# Patient Record
Sex: Male | Born: 1990 | Race: Black or African American | Hispanic: No | Marital: Married
Health system: Southern US, Community
[De-identification: ages and names within clinical notes are randomized; demographics above are authoritative.]

---
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
## 2012-01-13 NOTE — ED Provider Notes (Signed)
KNOWN ALLERGIES   NKDA   Seasonal       TRIAGE (Fri Jan 13, 2012 06:55 LDS0)   PATIENT: NAME: [name], [name], AGE: 21, GENDER: male, DOB: Mon         1991/01/12, TIME OF GREET: Fri Jan 13, 2012 06:49, LANGUAGE:         [place], [place]: [id number], KG WEIGHT: 131.5, MEDICAL RECORD NUMBER:         [id number], ACCOUNT NUMBER: [id number], PCP: Pt Denies,. (Fri Jan 13, 2012         06:55 LDS0)   ADMISSION: URGENCY: 3, TRANSPORT: Ambulatory, DEPT: Emergency,         BED: WAITING. (Fri Jan 13, 2012 06:55 LDS0)   VITAL SIGNS: BP 143/74, Pulse 62, Resp 14, Temp 98.6, Pain 9, O2         Sat 98, on Room air, Time 01/13/2012 06:54. (06:54 LDS0)   COMPLAINT:  Toothache H/A. Caleen Essex Jan 13, 2012 06:55 LDS0)   PRESENTING COMPLAINT:  PAIN RIGHT LOWER JAW WITH HEADACHE SINCE         0530. (06:56 LDS0)   PROVIDERS: TRIAGE NURSE: Bynum Bellows, RN, CEN. [phone] Jan 13, 2012 06:55 LDS0)   PREVIOUS VISIT ALLERGIES: Nkda, Seasonal. (Fri Jan 13, 2012 06:55         LDS0)       PRESENTING PROBLEM (06:55 LDS0)      Presenting problems: Dental Pain, Headache.       CURRENT MEDICATIONS (06:55 LDS0)   Patient not taking meds       NURSING ASSESSMENT: DENTAL (07:37 TRF0)   CONSTITUTIONAL: History obtained from patient, Patient arrives         ambulatory, Gait steady, Patient appears comfortable, Patient         cooperative, Patient alert, Oriented to person, place and time, Skin         warm, Skin dry, Skin normal in color, Mucous membranes pink, Mucous         membranes moist, Patient is well-groomed.   PAIN: throbbing pain, to left upper back tooth (teeth).   DENTAL: Dental assessment findings include mouth normal, Teeth         normal.       NURSING PROCEDURE: DISCHARGE NOTE (07:39 TRF0)   DISCHARGE: Patient discharged to home, ambulating without         assistance, driving self, unaccompanied, Discharge instructions given         to patient, Simple or moderate discharge teaching performed, by t.          fitz, rn, Prescriptions given and instructions on side effects given,         Name of prescription(s) given: naprosyn, pcn vk.       DIAGNOSIS (07:14 EI)   FINAL: PRIMARY: acute facial pain, ADDITIONAL: Cephalgia, Dental         caries.       DISPOSITION   PATIENT:  Disposition Type: Discharged, Disposition: Discharge,         Condition: Stable. (07:14 EI)      Patient left the department. (07:42 TRF0)       VITAL SIGNS (06:54 LDS0)   VITAL SIGNS: BP: 143/74, Pulse: 62, Resp: 14, Temp: 98.6, Pain:         9, O2 sat: 98 on Room air, Time: 01/13/2012 06:54.       INSTRUCTION (07:14 EI)   DISCHARGE:  DENTAL CARIES - (  [facility]), DENTAL PAIN - [facility]).   FOLLOWUPVinie Sill, SURGERY, [id number],         [place] [address], [phone].   SPECIAL:  Follow-up with a dentist as planned.         Return to the ER if condition worsens or new symptoms develop.       PRESCRIPTION (07:13 EI)   Naprosyn:  Tablet : 500 mg : Oral : Quantity: *** 1 *** Unit: tab         Route: Oral Schedule: 2 times a day Dispense: *** 20 ***.   NOTES:  No refills          May use generic.   Penicillin V Potassium:  Tablet : V potassium 500 mg : Oral :         Quantity: *** 1 *** Unit: tab Route: Oral Schedule: 4 times a day         Dispense: *** 28 ***.   NOTES   Key:     EI=Icenbice, PA-C, Erin  LDS0=Shoemaker, RN, CEN, [employer]  TRF0=Fitz, (TY),     RN, [employer]

## 2014-12-11 ENCOUNTER — Encounter: Attending: Surgery

## 2015-06-05 ENCOUNTER — Encounter (HOSPITAL_BASED_OUTPATIENT_CLINIC_OR_DEPARTMENT_OTHER): Payer: Self-pay | Admitting: Emergency Medicine

## 2015-06-05 ENCOUNTER — Emergency Department (HOSPITAL_BASED_OUTPATIENT_CLINIC_OR_DEPARTMENT_OTHER): Payer: Self-pay

## 2015-06-05 ENCOUNTER — Emergency Department (HOSPITAL_BASED_OUTPATIENT_CLINIC_OR_DEPARTMENT_OTHER)
Admission: EM | Admit: 2015-06-05 | Discharge: 2015-06-05 | Disposition: A | Discharge status: Home / Self Care | Payer: Self-pay | Attending: Emergency Medicine | Admitting: Emergency Medicine

## 2015-06-05 DIAGNOSIS — R200 Anesthesia of skin: Secondary | ICD-10-CM | POA: Insufficient documentation

## 2015-06-05 DIAGNOSIS — S8992XA Unspecified injury of left lower leg, initial encounter: Secondary | ICD-10-CM | POA: Insufficient documentation

## 2015-06-05 DIAGNOSIS — Y9289 Other specified places as the place of occurrence of the external cause: Secondary | ICD-10-CM | POA: Insufficient documentation

## 2015-06-05 DIAGNOSIS — W1843XA Slipping, tripping and stumbling without falling due to stepping from one level to another, initial encounter: Secondary | ICD-10-CM | POA: Insufficient documentation

## 2015-06-05 DIAGNOSIS — M25562 Pain in left knee: Secondary | ICD-10-CM

## 2015-06-05 DIAGNOSIS — Y998 Other external cause status: Secondary | ICD-10-CM | POA: Insufficient documentation

## 2015-06-05 DIAGNOSIS — S3992XA Unspecified injury of lower back, initial encounter: Secondary | ICD-10-CM | POA: Insufficient documentation

## 2015-06-05 DIAGNOSIS — Y9389 Activity, other specified: Secondary | ICD-10-CM | POA: Insufficient documentation

## 2015-06-05 NOTE — ED Notes (Signed)
Patient transported to X-ray 

## 2015-06-05 NOTE — ED Notes (Signed)
Patient back from  X-ray 

## 2015-06-05 NOTE — ED Notes (Addendum)
Pt states he woke up this morning and his knee gave out as he stood up to get out of bed. States it is painful to walk on L leg. No injury that he remembers. States he has taken 8 Naproxen today.

## 2015-06-05 NOTE — Discharge Instructions (Signed)

## 2015-06-05 NOTE — ED Provider Notes (Signed)
CSN: [id number]     Arrival date & time 06/05/15  2034 History  By signing my name below, I, Antonio Walton, attest that this documentation has been prepared under the direction and in the presence of Benjiman Core, MD. Electronically Signed: Doreatha Walton, ED Scribe. 06/05/2015. 9:00 PM.    Chief Complaint  Patient presents with  . Knee Pain   The history is provided by the patient. No language interpreter was used.    HPI Comments: Antonio Walton is a 24 y.o. male who presents to the Emergency Department complaining of moderate, sharp lateral left knee pain onset this morning after stepping out of bed. No new activities or injuries of note. He states he has not done anything differently in the past few days. He notes h/o herniated disc in the lower back with intermittent numbness in his legs, but he has never had sharp pains like this in his legs. Pt reports no relief with Ibuprofen or Naproxen. He states that pain is worsened with weight bearing or movement but he does not feel significant pain at rest.   History reviewed. No pertinent past medical history. History reviewed. No pertinent past surgical history. No family history on file. Social History  Substance Use Topics  . Smoking status: None  . Smokeless tobacco: None  . Alcohol Use: None    Review of Systems  Musculoskeletal: Positive for back pain and arthralgias.  Neurological: Negative for numbness.   Allergies  Review of patient's allergies indicates not on file.  Home Medications   Prior to Admission medications   Not on File   BP 146/94 mmHg  Pulse 85  Temp(Src) 98.5 F (36.9 C) (Oral)  Resp 20  Ht  (1.803 m)  Wt 330 lb (149.687 kg)  BMI 46.05 kg/m2  SpO2 97% Physical Exam  Constitutional: He is oriented to person, place, and time. He appears well-developed and well-nourished.  HENT:  Head: Normocephalic and atraumatic.  Eyes: Conjunctivae and EOM are normal. Pupils are equal, round, and reactive to  light.  Neck: Normal range of motion. Neck supple.  Cardiovascular: Normal rate.   Pulmonary/Chest: Effort normal. No respiratory distress.  Abdominal: He exhibits no distension.  Musculoskeletal: Normal range of motion. He exhibits tenderness.  Tenderness to the anterior lateral left knee. Good ROM. No erythema, no effusion. Good flexion and extension. No pain in varus or valgus strain. NVI in the foot.   Neurological: He is alert and oriented to person, place, and time.  Skin: Skin is warm and dry.  Psychiatric: He has a normal mood and affect. His behavior is normal.  Nursing note and vitals reviewed.  ED Course  Procedures (including critical care time) DIAGNOSTIC STUDIES: Oxygen Saturation is 97% on RA, normal by my interpretation.    COORDINATION OF CARE: 8:50 PM Discussed treatment plan with pt at bedside and pt agreed to plan.  Imaging Review Dg Knee Complete 4 Views Left  06/05/2015  CLINICAL DATA:  Sharp left knee pain, no injury EXAM: LEFT KNEE - COMPLETE 4+ VIEW COMPARISON:  None. FINDINGS: No fracture or dislocation is seen. The joint spaces are preserved. The visualized soft tissues are unremarkable. No definite suprapatellar knee joint effusion. IMPRESSION: No fracture or dislocation is seen. Electronically Signed   By: Charline Bills M.D.   On: 06/05/2015 21:21   I have personally reviewed and evaluated these images as part of my medical decision-making.  MDM   Final diagnoses:  Left knee pain    Patient  with knee pain. Negative x-ray. Overall rather benign exam. Will discharge home and have follow-up with sports medicine as needed. I, Benjiman CorePICKERING,Rhayne Chatwin R., personally performed the services described in this documentation. All medical record entries made by the scribe were at my direction and in my presence.  I have reviewed the chart and discharge instructions and agree that the record reflects my personal performance and is accurate and complete. [name]  R..  06/05/2015. 11:38 PM.     Benjiman CoreNathan Saniyyah Elster, MD 06/05/15 [phone]

## 2015-06-19 ENCOUNTER — Inpatient Hospital Stay
Admit: 2015-06-19 | Discharge: 2015-06-19 | Disposition: A | Discharge status: Home / Self Care | Payer: Self-pay | Attending: Emergency Medicine

## 2015-06-19 DIAGNOSIS — M5416 Radiculopathy, lumbar region: Secondary | ICD-10-CM

## 2015-06-19 MED ORDER — IBUPROFEN 600 MG TAB
600 mg | ORAL_TABLET | Freq: Four times a day (QID) | ORAL | 0 refills | Status: AC | PRN
Start: 2015-06-19 — End: ?

## 2015-06-19 MED ORDER — PREDNISONE 20 MG TAB
20 mg | ORAL_TABLET | ORAL | 0 refills | Status: AC
Start: 2015-06-19 — End: ?

## 2015-06-19 MED ORDER — METHOCARBAMOL 750 MG TAB
750 mg | ORAL_TABLET | Freq: Four times a day (QID) | ORAL | 0 refills | Status: AC | PRN
Start: 2015-06-19 — End: ?

## 2015-06-19 NOTE — ED Triage Notes (Signed)
Patient complains of intermittent lower back pain over the past 3 months, he states that over the past month the pain has worsened and is shooting down into his legs.

## 2015-06-19 NOTE — ED Provider Notes (Signed)
[facility]  Emergency Department Treatment Report    Patient: [name] Age: 24 y.o. Sex: male    Date of Birth: 02/09/1991 Admit Date: 06/19/2015 PCP: None   MRN: [id number]  CSN: [id number]     Room: H01/H01 Time Dictated: 9:56 AM        Chief Complaint   Chief Complaint   Patient presents with   ??? Back Pain   ??? Leg Pain       History of Present Illness   24 y.o. male presents with left lower back pain and pain shooting down left leg just below knee.  Also complains of left lateral knee pain.  No trauma.  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  No urinary retention or incontinence, no saddle anesthesia.      Review of Systems   Constitutional:  No fever, chills, or weight loss. Normal appetite  Eyes: No visual complaints.  ENT: No sore throat, runny nose or ear pain.  Heme/Lymph: No easy bruising or lymph node swelling  Respiratory: No cough, dyspnea or wheezing.  Cardiovascular: No chest pain, pressure, palpitations, tightness or heaviness.  Gastrointestinal: No vomiting, diarrhea or abdominal pain.  No melena or bright red blood per rectum  Genitourinary: No dysuria, frequency, or urgency.  No hematuria  Musculoskeletal: Back pain shoots down left leg.  No extremity pain in other extremities.  Lateral knee pain.  Integumentary: No rashes or other skin lesions  Neurological: No headaches, sensory or motor symptoms.  Denies complaints in all other systems      Past Medical/Surgical History     Past Medical History   Diagnosis Date   ??? Chronic back pain      History reviewed. No pertinent past surgical history.    Social History     Social History     Social History   ??? Marital status: SINGLE     Spouse name: N/A   ??? Number of children: N/A   ??? Years of education: N/A     Social History Main Topics   ??? Smoking status: Never Smoker   ??? Smokeless tobacco: None   ??? Alcohol use No   ??? Drug use: No   ??? Sexual activity: Not Asked     Other Topics Concern   ??? None      Social History Narrative   ??? None       Family History   History reviewed. No pertinent family history.    Current Medications     Current Outpatient Prescriptions   Medication Sig Dispense Refill   ??? predniSONE (DELTASONE) 20 mg tablet Take 3 tabs by mouth for 3 days, then 2 tabs by mouth for 3 days, then 1 tab by mouth for 3 days 18 Tab 0   ??? ibuprofen (MOTRIN) 600 mg tablet Take 1 Tab by mouth every six (6) hours as needed for Pain. 30 Tab 0   ??? methocarbamol (ROBAXIN-750) 750 mg tablet Take 1 Tab by mouth every six (6) hours as needed. 40 Tab 0     Allergies   No Known Allergies  Physical Exam   TRIAGE VITALS:   ED Triage Vitals   Enc Vitals Group      BP 06/19/15 0859 146/81      Pulse (Heart Rate) 06/19/15 0859 73      Resp Rate 06/19/15 0859 18      Temp 06/19/15 0859 98.3 ??F (36.8 ??  C)      Temp src --       O2 Sat (%) 06/19/15 0859 98 %      Weight 06/19/15 0859 330 lb      Height 06/19/15 0859 5\' 11"       Head Cir --       Peak Flow --       Pain Score --       Pain Loc --       Pain Edu? --       Excl. in GC? --        Constituational: Patient is afebrile, Vital signs reviewed, Well appearing, Patient appears comfortable, Alert and lucid, interacting appropriately  Head: Atraumatic, normocephalic, no cephalohematomas.  Eyes: Normal inspection. No conjunctival injection or discharge.  No scleral icterus.  PERRL.  Extraocular movements intact.  ENT:  No facial bruises, normal external ear and nose inspection.  Mucous membranes moist. Uvula midline.  No injection or exudate.    Neck:  Supple , non tender. normal inspection.  No JVD, stridor, or nuchal rigidity.  No midline TTP.    Cardiovascular:  regular rate and rhythm, heart sounds normal without murmurs.  DP and PT pulses 2+ and equal bilaterally  Respiratory:  No respiratory distress, breath sounds normal.  No rales or wheezing  Abdomen: soft, non tender.  No guarding. BS present, soft, no organomegaly or masses.  No pulsatile lesions.   Back: tenderness left lumbar paraspinous muscles.     Musculoskeletal:  normal ROM, Left lateral knee TTP with no bony ttp, joint is stable to ant/post/varus/valgus stress , no pedal edema.  Calves soft, symmetric, and non-tender with no palpable cords.   Skin: color normal, no rash, warm, dry .  Neuro: awake & alert, lucid, no motor/sensory deficit. Upper extremity and lower extremity strength and sensation are grossly normal, intact, and symmetric.  CN 3-12 normal, intact, and symmetric.  Psych: mood/affect normal.    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  No back pain red flags, no saddle anesthesia, no urinary retention or incontinence, he is able to ambulate albeit with pain.  Due to a non-focal neurologic exam with no evidence of cauda equina, and no risk factors or evidence for fracture, epidural hematoma, or epidural abscess, advanced imaging is not indicated and the patient is being discharged home with symptomatic treatment and our standard return precautions for low back pain and sciatica symptoms.  The patient understands to return immediately for worsening pain, uncontrolled pain, of if he develops any of the red flags that we discussed.    Diagnostic Studies   Lab:   Labs Reviewed - No data to display    Imaging:    No results found.    ED Course   Patient remained clinically stable throughout the emergency room visit.  Vitals were unremarkable and the patient's condition required no further Emergency Department intervention.      MOST RECENT VITALS   Visit Vitals   ??? BP 146/81 (BP 1 Location: Left arm, BP Patient Position: At rest)   ??? Pulse 73   ??? Temp 98.3 ??F (36.8 ??C)   ??? Resp 18   ??? Ht 5\' 11"  (1.803 m)   ??? Wt 149.7 kg (330 lb)   ??? SpO2 98%   ??? BMI 46.03 kg/m2       Medical Decision Making   No evidence of cauda equina  Final Diagnosis       ICD-10-CM ICD-9-CM   1. Acute left lumbar radiculopathy M54.16 724.4      Disposition   The patient is discharged home with verbal and written instructions and a referral for ongoing care.  The patient is aware that they may return at any time for new or worsening symptoms and understand their responsibility to do so if there are any concerns.  The patient verbalized understanding of their discharge instructions.   Prescriptions were provided as appropriate.          Johny Drilling, MD  June 19, 2015    My signature above authenticates this document and my orders, the final ??  diagnosis (es), discharge prescription (s), and instructions in the Epic ??  record.  If you have any questions please contact [phone].  ??  Nursing notes have been reviewed by the physician/ advanced practice ??  Clinician.

## 2015-06-19 NOTE — ED Notes (Signed)
I have reviewed discharge instructions with the patient.  The patient verbalized understanding.

## 2015-07-15 ENCOUNTER — Encounter: Attending: Physical Medicine & Rehabilitation

## 2016-03-19 IMAGING — DX DG KNEE COMPLETE 4+V*L*
4 series · 4 of 4 positions shown · non-contrast
Comparison: None.

CLINICAL DATA: Sharp left knee pain, no injury

EXAM:
LEFT KNEE - COMPLETE 4+ VIEW

[knee ap]
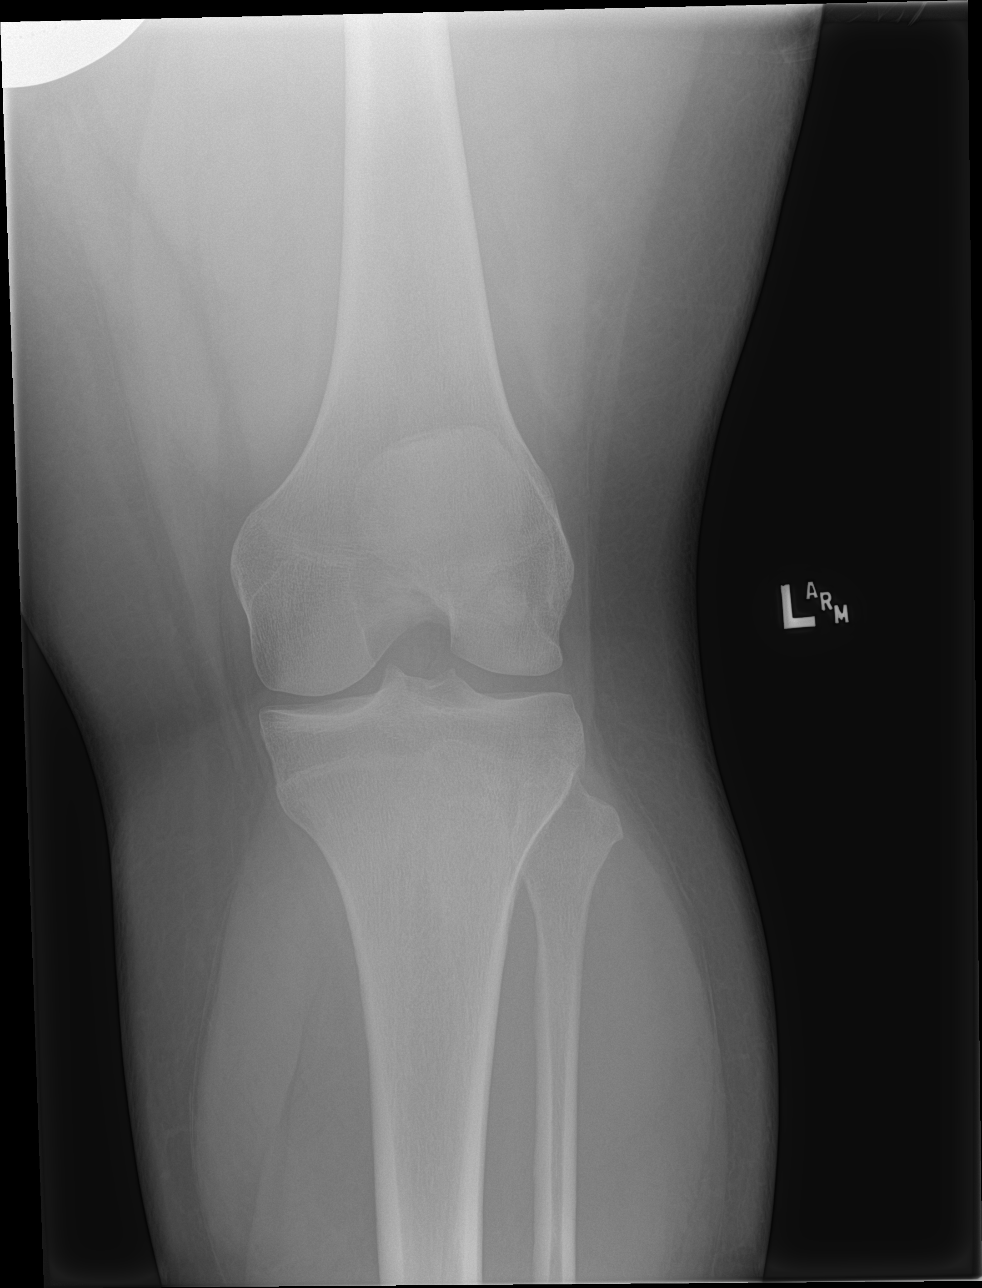

[knee lat]
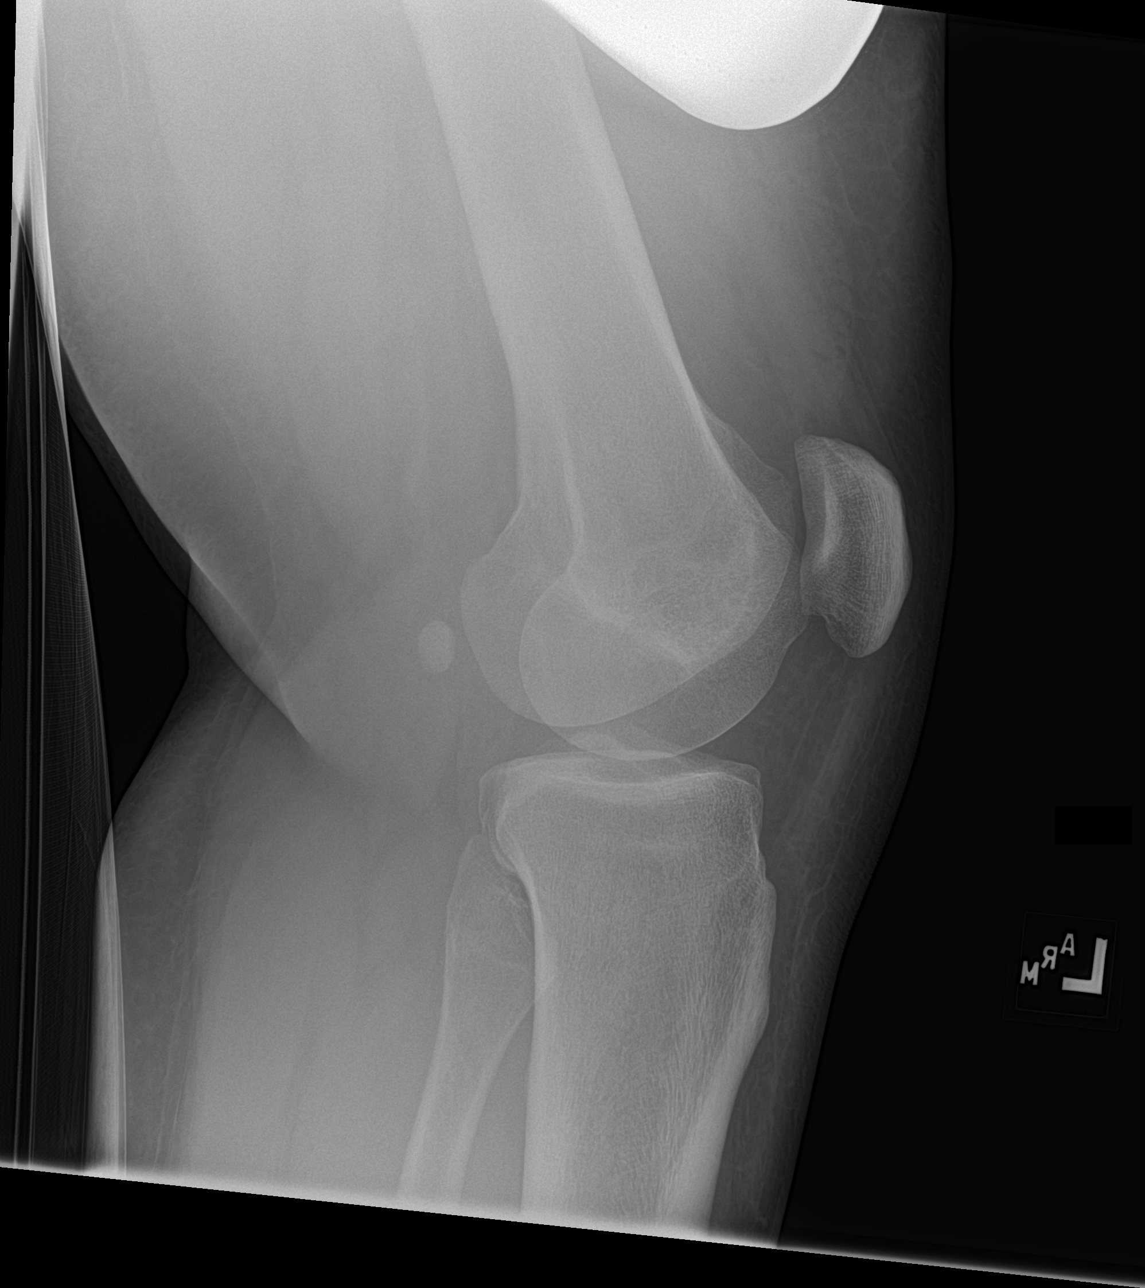

[knee obl (1 of 2)]
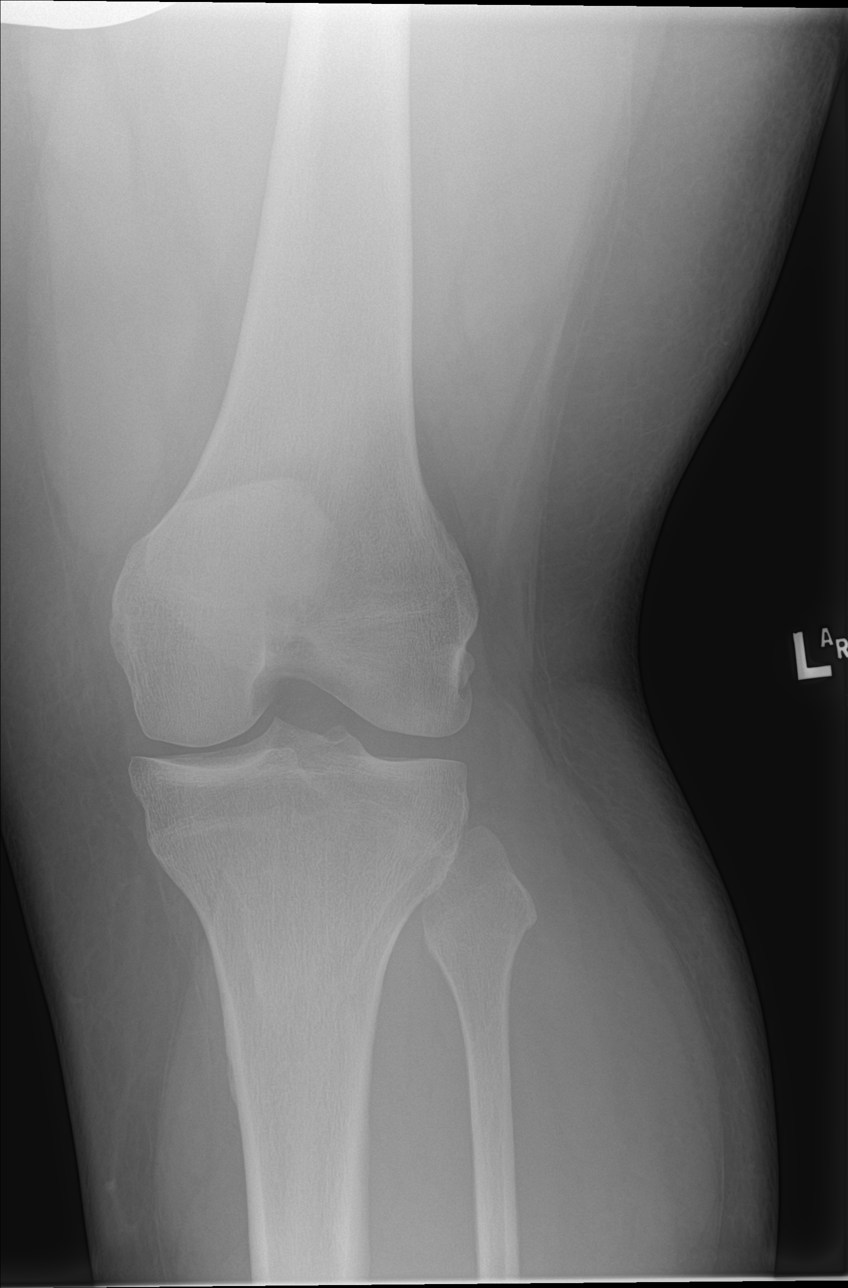

[knee obl (2 of 2)]
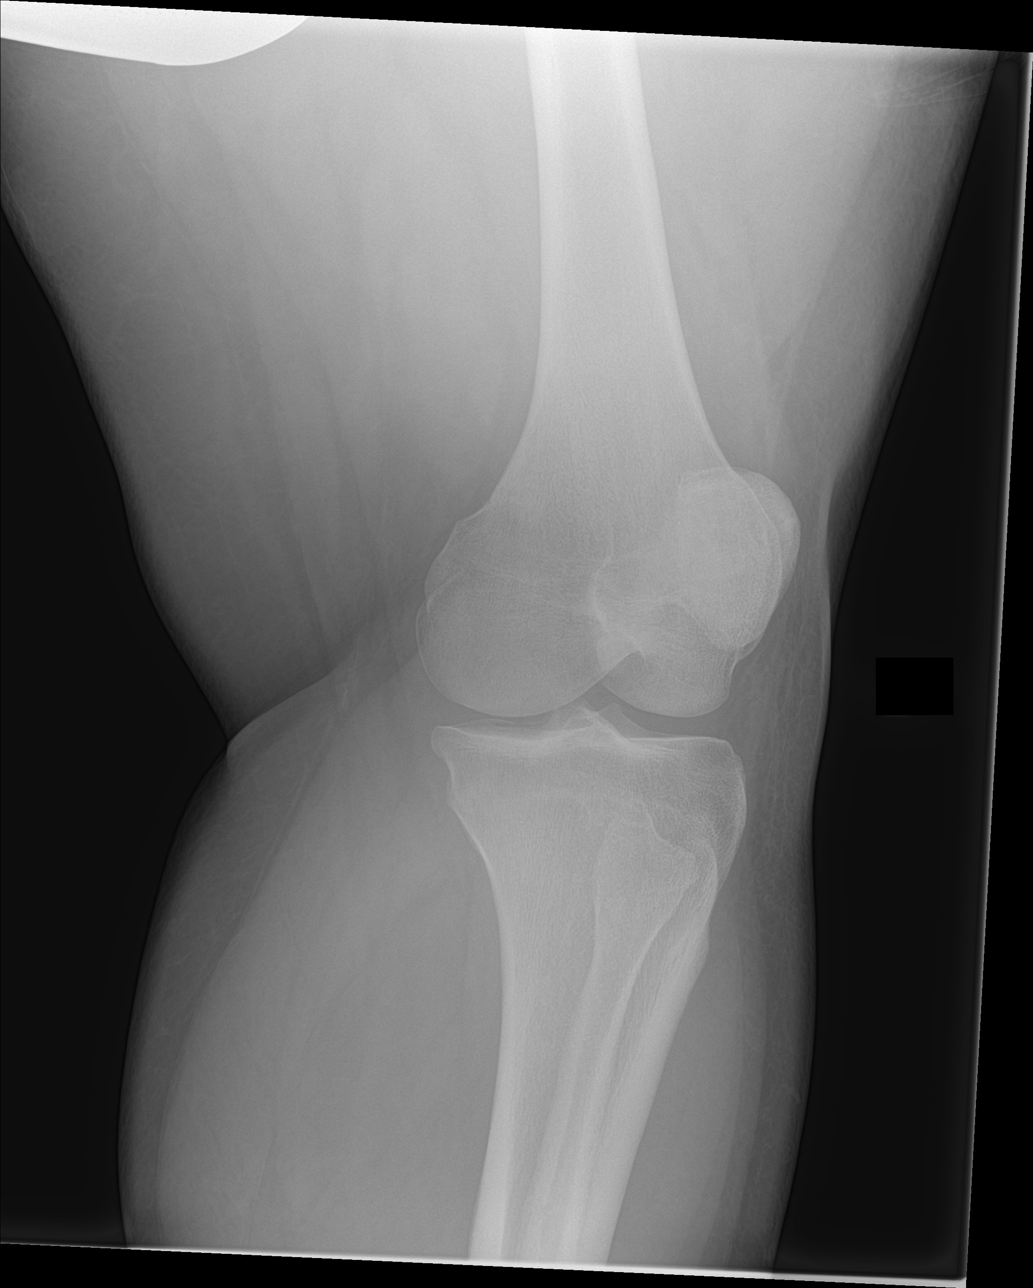

[4 of 4 positions shown; findings below may reference images not displayed]

FINDINGS: No fracture or dislocation is seen.

The joint spaces are preserved.

The visualized soft tissues are unremarkable.

No definite suprapatellar knee joint effusion.
IMPRESSION: No fracture or dislocation is seen.
